# Patient Record
Sex: Male | Born: 1985 | Race: Black or African American | Marital: Single | Smoking: Current every day smoker
Health system: Southern US, Community
[De-identification: ages and names within clinical notes are randomized; demographics above are authoritative.]

---

## 2014-02-16 ENCOUNTER — Encounter (HOSPITAL_COMMUNITY): Payer: Self-pay | Admitting: Emergency Medicine

## 2014-02-16 ENCOUNTER — Emergency Department (HOSPITAL_COMMUNITY)
Admission: EM | Admit: 2014-02-16 | Discharge: 2014-02-16 | Disposition: A | Discharge status: Home / Self Care | Payer: No Typology Code available for payment source | Attending: Emergency Medicine | Admitting: Emergency Medicine

## 2014-02-16 DIAGNOSIS — Y92410 Unspecified street and highway as the place of occurrence of the external cause: Secondary | ICD-10-CM | POA: Insufficient documentation

## 2014-02-16 DIAGNOSIS — F17200 Nicotine dependence, unspecified, uncomplicated: Secondary | ICD-10-CM | POA: Insufficient documentation

## 2014-02-16 DIAGNOSIS — Y9389 Activity, other specified: Secondary | ICD-10-CM | POA: Insufficient documentation

## 2014-02-16 DIAGNOSIS — S335XXA Sprain of ligaments of lumbar spine, initial encounter: Secondary | ICD-10-CM | POA: Insufficient documentation

## 2014-02-16 DIAGNOSIS — S39012A Strain of muscle, fascia and tendon of lower back, initial encounter: Secondary | ICD-10-CM

## 2014-02-16 MED ORDER — CYCLOBENZAPRINE HCL 10 MG PO TABS: 10.0000 mg | ORAL_TABLET | Freq: Two times a day (BID) | ORAL | Status: AC | PRN

## 2014-02-16 MED ORDER — HYDROCODONE-ACETAMINOPHEN 5-325 MG PO TABS: 2.0000 | ORAL_TABLET | Freq: Four times a day (QID) | ORAL | Status: AC | PRN
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
## 2014-02-16 NOTE — ED Notes (Signed)
PT ambulated with baseline gait; VSS; A&Ox3; no signs of distress; respirations even and unlabored; skin warm and dry; no questions upon discharge.  
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
## 2014-03-01 NOTE — ED Provider Notes (Signed)
Medical screening examination/treatment/procedure(s) were performed by non-physician practitioner and as supervising physician I was immediately available for consultation/collaboration.   EKG Interpretation None        William Soham Hollett, MD 03/01/14 2328 

## 2014-04-10 ENCOUNTER — Encounter (HOSPITAL_COMMUNITY): Payer: Self-pay | Admitting: Emergency Medicine

## 2014-04-10 ENCOUNTER — Emergency Department (HOSPITAL_COMMUNITY)
Admission: EM | Admit: 2014-04-10 | Discharge: 2014-04-10 | Disposition: A | Discharge status: Home / Self Care | Payer: No Typology Code available for payment source | Attending: Emergency Medicine | Admitting: Emergency Medicine

## 2014-04-10 ENCOUNTER — Emergency Department (HOSPITAL_COMMUNITY): Payer: No Typology Code available for payment source

## 2014-04-10 DIAGNOSIS — X500XXA Overexertion from strenuous movement or load, initial encounter: Secondary | ICD-10-CM | POA: Insufficient documentation

## 2014-04-10 DIAGNOSIS — Y9339 Activity, other involving climbing, rappelling and jumping off: Secondary | ICD-10-CM | POA: Insufficient documentation

## 2014-04-10 DIAGNOSIS — F17200 Nicotine dependence, unspecified, uncomplicated: Secondary | ICD-10-CM | POA: Insufficient documentation

## 2014-04-10 DIAGNOSIS — S93409A Sprain of unspecified ligament of unspecified ankle, initial encounter: Secondary | ICD-10-CM | POA: Insufficient documentation

## 2014-04-10 DIAGNOSIS — Y929 Unspecified place or not applicable: Secondary | ICD-10-CM | POA: Insufficient documentation

## 2014-04-10 DIAGNOSIS — S93401A Sprain of unspecified ligament of right ankle, initial encounter: Secondary | ICD-10-CM

## 2014-04-10 MED ORDER — NAPROXEN 500 MG PO TABS
500.0000 mg | ORAL_TABLET | Freq: Two times a day (BID) | ORAL | Status: AC
Start: 2014-04-10 — End: ?

## 2014-04-10 NOTE — ED Provider Notes (Signed)
CSN: [id number]     Arrival date & time 04/10/14  1050 History  This chart was scribed for non-physician practitioner, Antony MaduraKelly Glorya Bartley, PA-C working with Shanna CiscoMegan E Docherty, MD by Greggory StallionKayla Andersen, ED scribe. This patient was seen in room TR06C/TR06C and the patient's care was started at 11:06 AM.   Chief Complaint  Patient presents with  . Ankle Pain   The history is provided by the patient. No language interpreter was used.   HPI Comments: Jared Becker is a 28 y.o. male who presents to the Emergency Department complaining of sudden onset right ankle pain with associated swelling that started yesterday. States he was jumping up at ballet rehearsal and heard a pop in his ankle. Bearing weight worsens the pain. Pt has used ice with little relief. Denies numbness, loss of sensation. He has sprained his right ankle several times in the past but denies any other past injuries.   History reviewed. No pertinent past medical history. History reviewed. No pertinent past surgical history. History reviewed. No pertinent family history. History  Substance Use Topics  . Smoking status: Current Every Day Smoker -- 0.25 packs/day  . Smokeless tobacco: Not on file  . Alcohol Use: Yes    Review of Systems  Musculoskeletal: Positive for arthralgias and joint swelling.  Neurological: Negative for numbness.  All other systems reviewed and are negative.   Allergies  Review of patient's allergies indicates no known allergies.  Home Medications   Prior to Admission medications   Medication Sig Start Date End Date Taking? Authorizing Provider  cyclobenzaprine (FLEXERIL) 10 MG tablet Take 1 tablet (10 mg total) by mouth 2 (two) times daily as needed for muscle spasms. 02/16/14   Irish EldersKelly Walker, NP  HYDROcodone-acetaminophen (NORCO/VICODIN) 5-325 MG per tablet Take 2 tablets by mouth every 6 (six) hours as needed. 02/16/14   Irish EldersKelly Walker, NP   BP 124/80  Pulse 68  Temp(Src) 98.6 F (37 C) (Oral)  Resp 18   Wt 155 lb (70.308 kg)  SpO2 100%  Physical Exam  Nursing note and vitals reviewed. Constitutional: He is oriented to person, place, and time. He appears well-developed and well-nourished. No distress.  HENT:  Head: Normocephalic and atraumatic.  Eyes: Conjunctivae and EOM are normal. No scleral icterus.  Neck: Normal range of motion.  Cardiovascular: Normal rate, regular rhythm and intact distal pulses.   Pulses:      Dorsalis pedis pulses are 2+ on the right side.       Posterior tibial pulses are 2+ on the right side.  Pulmonary/Chest: Effort normal. No respiratory distress.  Musculoskeletal: Normal range of motion.  Tenderness to palpation of right achilles tendon. Normal integrity of achilles tendon. Tenderness to palpation of anterior lateral malleolus of ankle with mild swelling. No erythema or warmth to touch.   Neurological: He is alert and oriented to person, place, and time.  No gross sensory deficits appreciated. Patellar and Achilles reflexes 2+ bilaterally.  Skin: Skin is warm and dry. No rash noted. He is not diaphoretic. No erythema. No pallor.  Psychiatric: He has a normal mood and affect. His behavior is normal.    ED Course  Procedures (including critical care time)  DIAGNOSTIC STUDIES: Oxygen Saturation is 100% on RA, normal by my interpretation.    COORDINATION OF CARE: 11:08 AM-Discussed treatment plan which includes xray, RICE and ibuprofen with pt at bedside and pt agreed to plan.   Labs Review Labs Reviewed - No data to display  Imaging Review Dg  Ankle Complete Right  04/10/2014   CLINICAL DATA:  ANKLE PAIN after rolling injury, popping sound, mostly lateral  EXAM: RIGHT ANKLE - COMPLETE 3+ VIEW  COMPARISON:  None.  FINDINGS: There is no evidence of fracture, dislocation, or joint effusion. There is no evidence of arthropathy or other focal bone abnormality. Soft tissues are unremarkable.  IMPRESSION: Negative.   Electronically Signed   By: Oley Balmaniel  Hassell  M.D.   On: 04/10/2014 11:28     EKG Interpretation None      MDM   Final diagnoses:  Sprain of ankle, right    Uncomplicated ankle sprain. Patient neurovascularly intact. No gross sensory deficits appreciated. No evidence of septic joint. Imaging negative for fracture, dislocation, or bony deformity. ASO ankle applied. Patient stable for discharge with prescription for naproxen and instructions for RICE. Orthopedic referral provided should symptoms persist. Return precautions discussed and patient agreeable to plan with no unaddressed concerns.  I personally performed the services described in this documentation, which was scribed in my presence. The recorded information has been reviewed and is accurate.  Filed Vitals:   04/10/14 1056 04/10/14 1143  BP: 136/87 124/80  Pulse: 79 68  Temp: 98.6 F (37 C)   TempSrc: Oral   Resp: 18 18  Weight: 155 lb (70.308 kg)   SpO2: 100% 100%     Antony MaduraKelly Emmogene Simson, PA-C 04/10/14 1210

## 2014-04-10 NOTE — Discharge Instructions (Signed)
Ankle Sprain °An ankle sprain is an injury to the strong, fibrous tissues (ligaments) that hold the bones of your ankle joint together.  °CAUSES °An ankle sprain is usually caused by a fall or by twisting your ankle. Ankle sprains most commonly occur when you step on the outer edge of your foot, and your ankle turns inward. People who participate in sports are more prone to these types of injuries.  °SYMPTOMS  °· Pain in your ankle. The pain may be present at rest or only when you are trying to stand or walk. °· Swelling. °· Bruising. Bruising may develop immediately or within 1 to 2 days after your injury. °· Difficulty standing or walking, particularly when turning corners or changing directions. °DIAGNOSIS  °Your caregiver will ask you details about your injury and perform a physical exam of your ankle to determine if you have an ankle sprain. During the physical exam, your caregiver will press on and apply pressure to specific areas of your foot and ankle. Your caregiver will try to move your ankle in certain ways. An X-ray exam may be done to be sure a bone was not broken or a ligament did not separate from one of the bones in your ankle (avulsion fracture).  °TREATMENT  °Certain types of braces can help stabilize your ankle. Your caregiver can make a recommendation for this. Your caregiver may recommend the use of medicine for pain. If your sprain is severe, your caregiver may refer you to a surgeon who helps to restore function to parts of your skeletal system (orthopedist) or a physical therapist. °HOME CARE INSTRUCTIONS  °· Apply ice to your injury for 1 2 days or as directed by your caregiver. Applying ice helps to reduce inflammation and pain. °· Put ice in a plastic bag. °· Place a towel between your skin and the bag. °· Leave the ice on for 15-20 minutes at a time, every 2 hours while you are awake. °· Only take over-the-counter or prescription medicines for pain, discomfort, or fever as directed by  your caregiver. °· Elevate your injured ankle above the level of your heart as much as possible for 2 3 days. °· If your caregiver recommends crutches, use them as instructed. Gradually put weight on the affected ankle. Continue to use crutches or a cane until you can walk without feeling pain in your ankle. °· If you have a plaster splint, wear the splint as directed by your caregiver. Do not rest it on anything harder than a pillow for the first 24 hours. Do not put weight on it. Do not get it wet. You may take it off to take a shower or bath. °· You may have been given an elastic bandage to wear around your ankle to provide support. If the elastic bandage is too tight (you have numbness or tingling in your foot or your foot becomes cold and blue), adjust the bandage to make it comfortable. °· If you have an air splint, you may blow more air into it or let air out to make it more comfortable. You may take your splint off at night and before taking a shower or bath. Wiggle your toes in the splint several times per day to decrease swelling. °SEEK MEDICAL CARE IF:  °· You have rapidly increasing bruising or swelling. °· Your toes feel extremely cold or you lose feeling in your foot. °· Your pain is not relieved with medicine. °SEEK IMMEDIATE MEDICAL CARE IF: °· Your toes are numb   or blue. °· You have severe pain that is increasing. °MAKE SURE YOU:  °· Understand these instructions. °· Will watch your condition. °· Will get help right away if you are not doing well or get worse. °Document Released: 11/18/2005 Document Revised: 08/12/2012 Document Reviewed: 11/30/2011 °ExitCare® Patient Information ©2014 [employer]. °RICE: Routine Care for Injuries °The routine care of many injuries includes Rest, Ice, Compression, and Elevation (RICE). °HOME CARE INSTRUCTIONS °· Rest is needed to allow your body to heal. Routine activities can usually be resumed when comfortable. Injured tendons and bones can take up to 6 weeks to  heal. Tendons are the cord-like structures that attach muscle to bone. °· Ice following an injury helps keep the swelling down and reduces pain. °· Put ice in a plastic bag. °· Place a towel between your skin and the bag. °· Leave the ice on for 15-20 minutes, 03-04 times a day. Do this while awake, for the first 24 to 48 hours. After that, continue as directed by your caregiver. °· Compression helps keep swelling down. It also gives support and helps with discomfort. If an elastic bandage has been applied, it should be removed and reapplied every 3 to 4 hours. It should not be applied tightly, but firmly enough to keep swelling down. Watch fingers or toes for swelling, bluish discoloration, coldness, numbness, or excessive pain. If any of these problems occur, remove the bandage and reapply loosely. Contact your caregiver if these problems continue. °· Elevation helps reduce swelling and decreases pain. With extremities, such as the arms, hands, legs, and feet, the injured area should be placed near or above the level of the heart, if possible. °SEEK IMMEDIATE MEDICAL CARE IF: °· You have persistent pain and swelling. °· You develop redness, numbness, or unexpected weakness. °· Your symptoms are getting worse rather than improving after several days. °These symptoms may indicate that further evaluation or further X-rays are needed. Sometimes, X-rays may not show a small broken bone (fracture) until 1 week or 10 days later. Make a follow-up appointment with your caregiver. Ask when your X-ray results will be ready. Make sure you get your X-ray results. °Document Released: 03/02/2001 Document Revised: 02/10/2012 Document Reviewed: 04/19/2011 °ExitCare® Patient Information ©2014 [employer]. ° °

## 2014-04-10 NOTE — ED Notes (Signed)
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

## 2014-04-11 NOTE — ED Provider Notes (Signed)
Medical screening examination/treatment/procedure(s) were performed by non-physician practitioner and as supervising physician I was immediately available for consultation/collaboration.   Mikhaila Roh E Jalilah Wiltsie, MD 04/11/14 1659 

## 2015-01-17 IMAGING — CR DG ANKLE COMPLETE 3+V*R*
3 series · 3 of 3 positions shown · non-contrast
Comparison: None.

CLINICAL DATA: ANKLE PAIN after rolling injury, popping sound,
mostly lateral

EXAM:
RIGHT ANKLE - COMPLETE 3+ VIEW

[t ankle joint ap right]
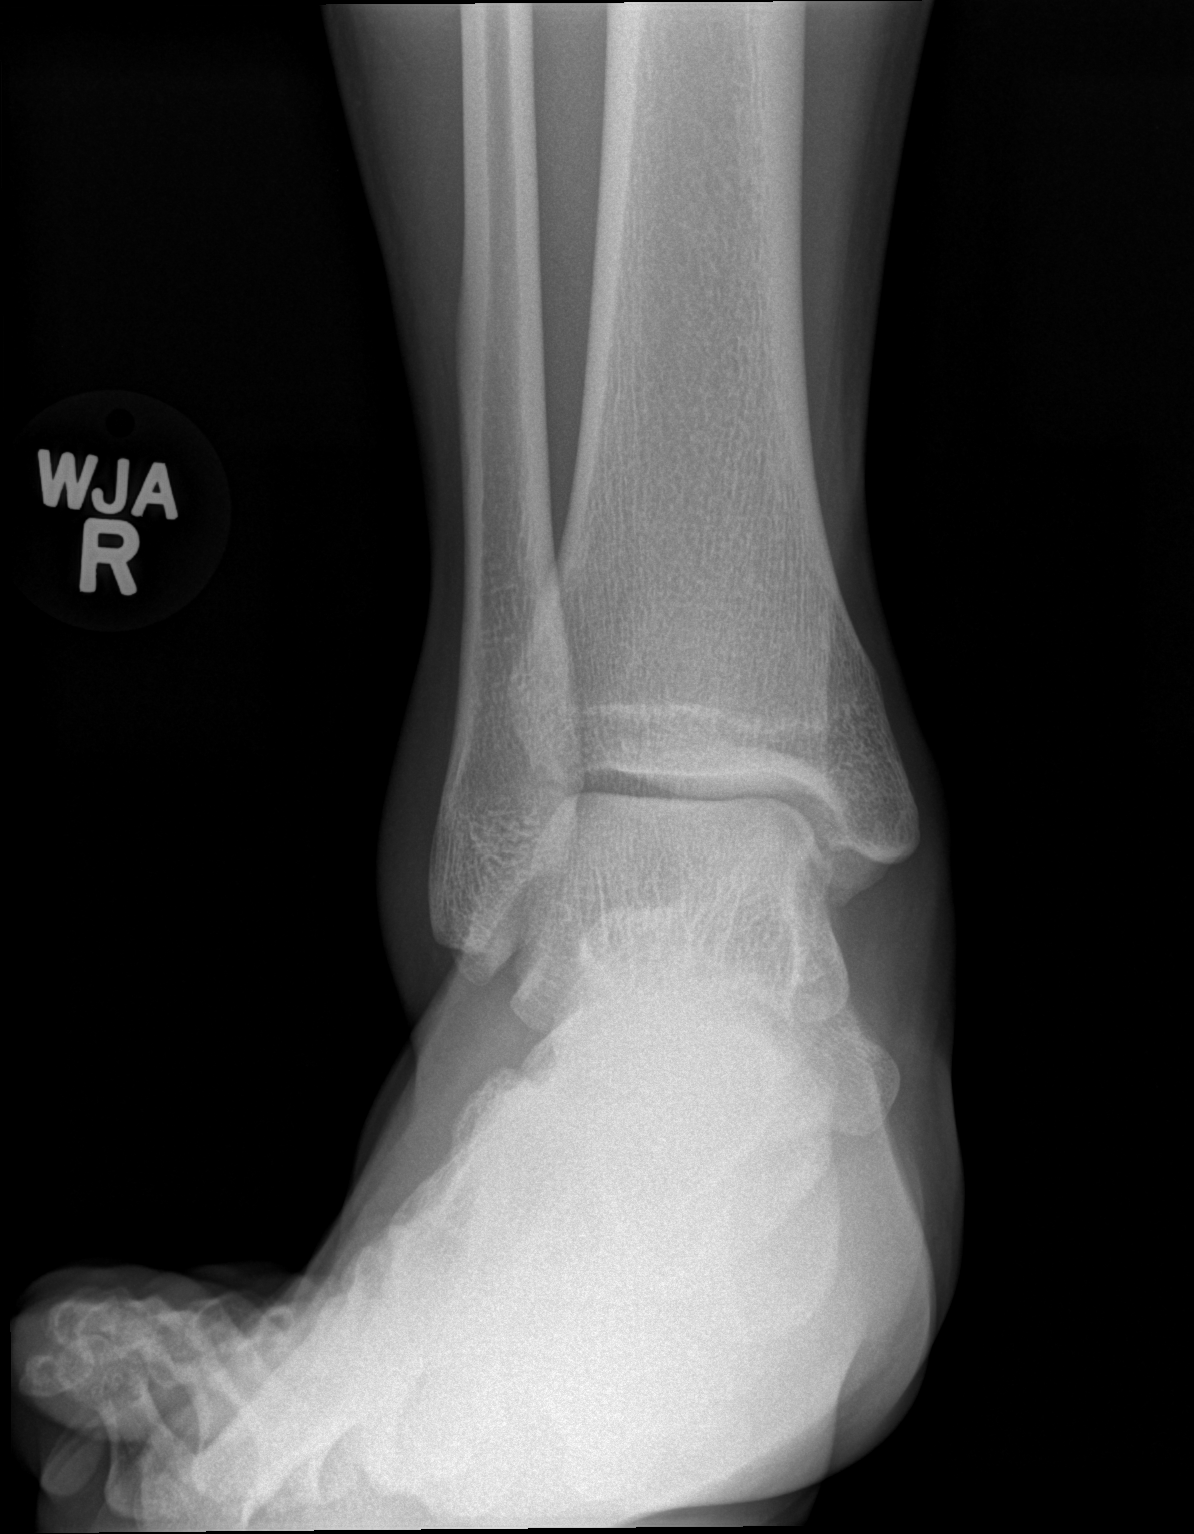

[t ankle joint oblique right]
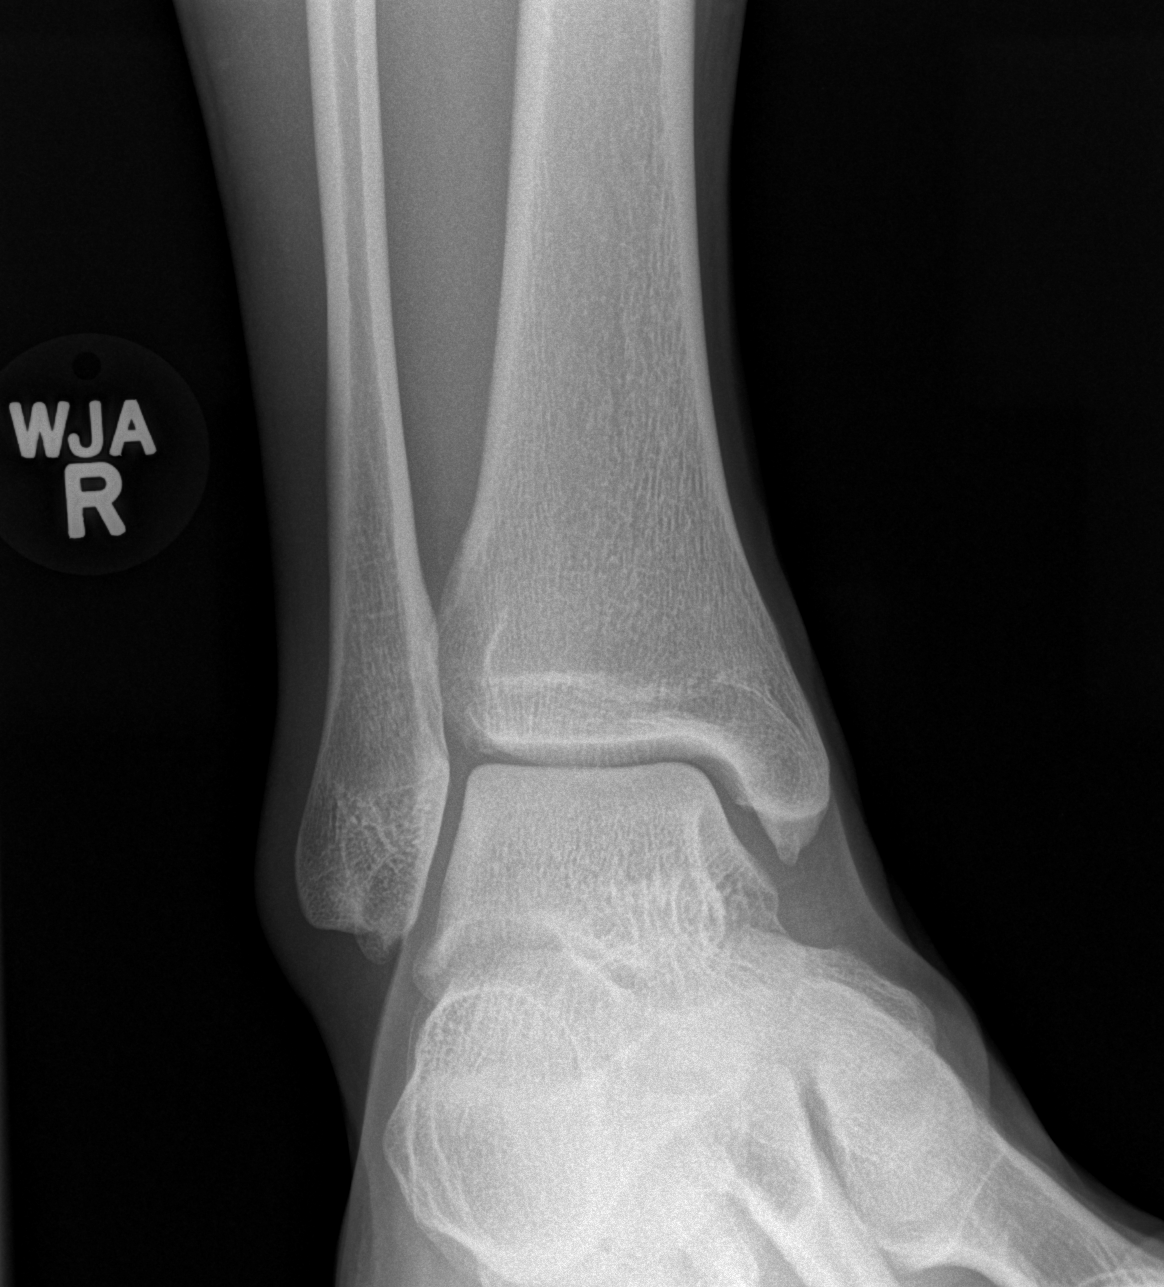

[t ankle joint lat right]
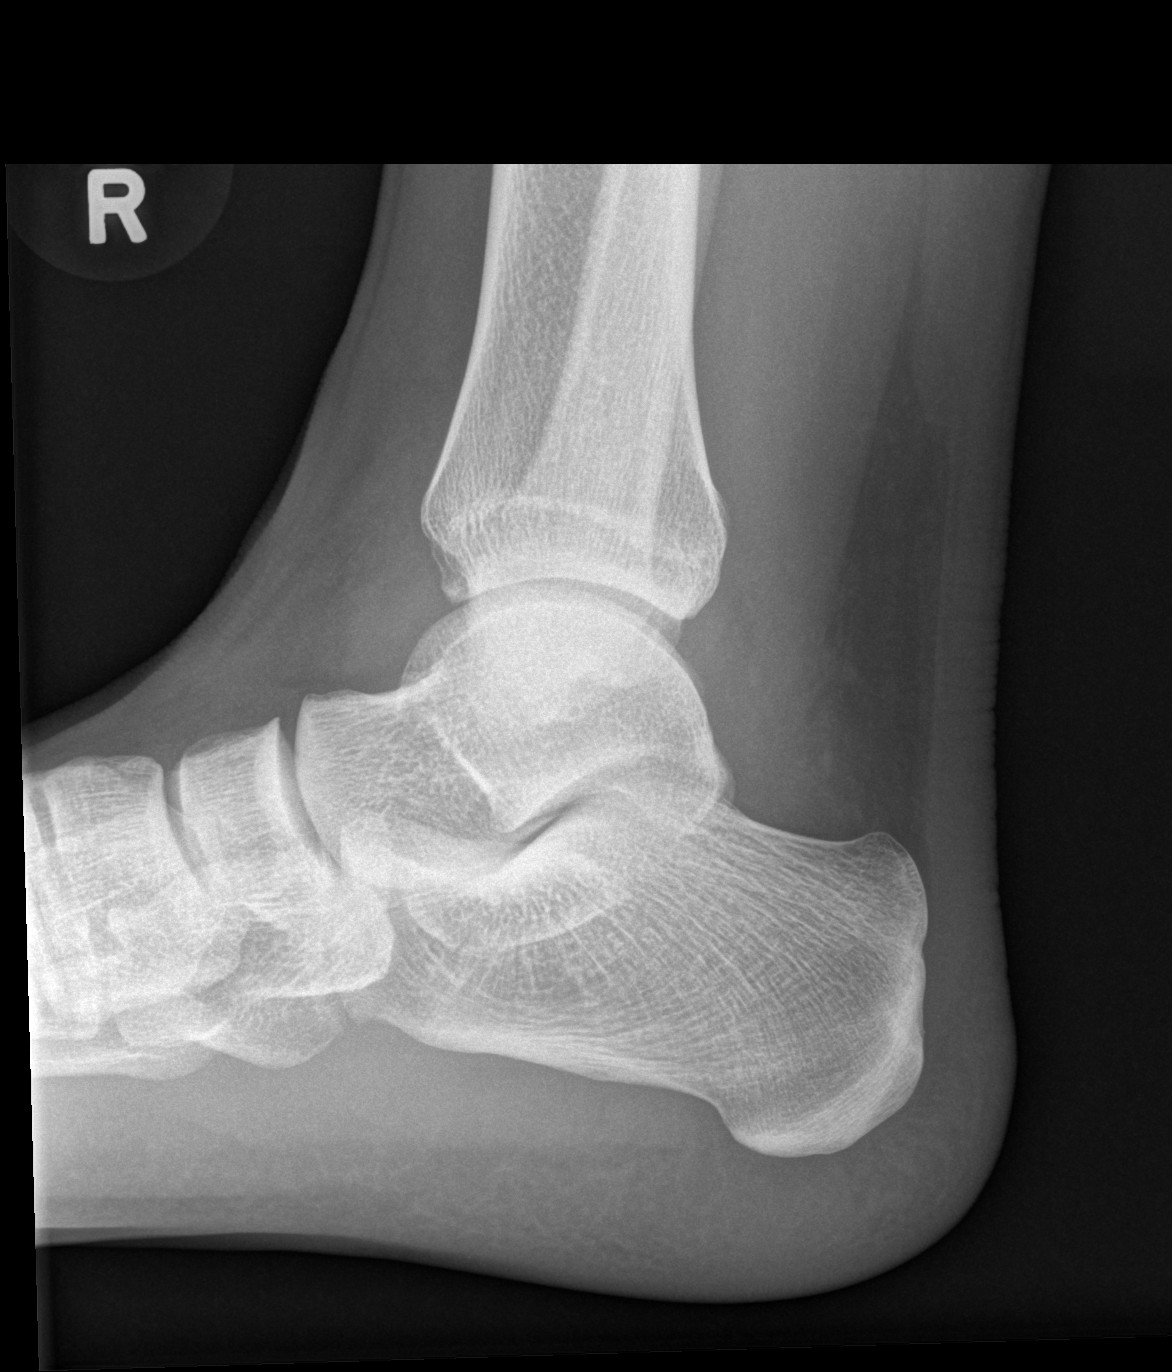

[3 of 3 positions shown; findings below may reference images not displayed]

FINDINGS: There is no evidence of fracture, dislocation, or joint effusion.
There is no evidence of arthropathy or other focal bone abnormality.
Soft tissues are unremarkable.
IMPRESSION: Negative.
